# Patient Record
Sex: Female | Born: 1965 | Race: White | Hispanic: No | Marital: Married
Health system: Southern US, Community
[De-identification: ages and names within clinical notes are randomized; demographics above are authoritative.]

## PROBLEM LIST (undated history)

## (undated) DIAGNOSIS — I10 Essential (primary) hypertension: Secondary | ICD-10-CM

---

## 2017-04-09 ENCOUNTER — Emergency Department: Payer: BLUE CROSS/BLUE SHIELD

## 2017-04-09 ENCOUNTER — Encounter: Payer: Self-pay | Admitting: Medical Oncology

## 2017-04-09 ENCOUNTER — Emergency Department
Admission: EM | Admit: 2017-04-09 | Discharge: 2017-04-09 | Disposition: A | Discharge status: Home / Self Care | Payer: BLUE CROSS/BLUE SHIELD | Attending: Student in an Organized Health Care Education/Training Program | Admitting: Student in an Organized Health Care Education/Training Program

## 2017-04-09 DIAGNOSIS — Y939 Activity, unspecified: Secondary | ICD-10-CM | POA: Insufficient documentation

## 2017-04-09 DIAGNOSIS — M542 Cervicalgia: Secondary | ICD-10-CM | POA: Diagnosis not present

## 2017-04-09 DIAGNOSIS — I10 Essential (primary) hypertension: Secondary | ICD-10-CM | POA: Diagnosis not present

## 2017-04-09 DIAGNOSIS — M25521 Pain in right elbow: Secondary | ICD-10-CM | POA: Diagnosis not present

## 2017-04-09 DIAGNOSIS — R1012 Left upper quadrant pain: Secondary | ICD-10-CM | POA: Diagnosis not present

## 2017-04-09 DIAGNOSIS — Y92411 Interstate highway as the place of occurrence of the external cause: Secondary | ICD-10-CM | POA: Insufficient documentation

## 2017-04-09 DIAGNOSIS — Y999 Unspecified external cause status: Secondary | ICD-10-CM | POA: Diagnosis not present

## 2017-04-09 DIAGNOSIS — M25561 Pain in right knee: Secondary | ICD-10-CM | POA: Diagnosis not present

## 2017-04-09 DIAGNOSIS — R52 Pain, unspecified: Secondary | ICD-10-CM

## 2017-04-09 HISTORY — DX: Essential (primary) hypertension: I10

## 2017-04-09 LAB — CBC WITH DIFFERENTIAL/PLATELET
BASOS ABS: 0.1 10*3/uL (ref 0–0.1)
BASOS PCT: 1 %
Eosinophils Absolute: 0.2 10*3/uL (ref 0–0.7)
Eosinophils Relative: 2 %
HCT: 41.4 % (ref 35.0–47.0)
Hemoglobin: 14.4 g/dL (ref 12.0–16.0)
LYMPHS ABS: 1.6 10*3/uL (ref 1.0–3.6)
Lymphocytes Relative: 20 %
MCH: 29.9 pg (ref 26.0–34.0)
MCHC: 34.9 g/dL (ref 32.0–36.0)
MCV: 85.7 fL (ref 80.0–100.0)
MONO ABS: 0.6 10*3/uL (ref 0.2–0.9)
MONOS PCT: 7 %
NEUTROS ABS: 5.8 10*3/uL (ref 1.4–6.5)
NEUTROS PCT: 70 %
PLATELETS: 320 10*3/uL (ref 150–440)
RBC: 4.84 MIL/uL (ref 3.80–5.20)
RDW: 12.9 % (ref 11.5–14.5)
WBC: 8.2 10*3/uL (ref 3.6–11.0)

## 2017-04-09 LAB — BASIC METABOLIC PANEL
Anion gap: 6 (ref 5–15)
BUN: 15 mg/dL (ref 6–20)
CALCIUM: 9.3 mg/dL (ref 8.9–10.3)
CO2: 29 mmol/L (ref 22–32)
Chloride: 101 mmol/L (ref 101–111)
Creatinine, Ser: 0.98 mg/dL (ref 0.44–1.00)
GFR calc Af Amer: >60 mL/min (ref >60)
Glucose, Bld: 102 mg/dL — ABNORMAL HIGH (ref 65–99)
POTASSIUM: 3.3 mmol/L — AB (ref 3.5–5.1)
SODIUM: 136 mmol/L (ref 135–145)

## 2017-04-09 LAB — URINALYSIS, COMPLETE (UACMP) WITH MICROSCOPIC
BILIRUBIN URINE: NEGATIVE
Glucose, UA: NEGATIVE mg/dL
Hgb urine dipstick: NEGATIVE
LEUKOCYTES UA: NEGATIVE
Nitrite: NEGATIVE
PH: 6 (ref 5.0–8.0)
Protein, ur: 30 mg/dL — AB
Specific Gravity, Urine: 1.02 (ref 1.005–1.030)

## 2017-04-09 MED ORDER — CYCLOBENZAPRINE HCL 10 MG PO TABS: 10.0000 mg | ORAL_TABLET | Freq: Three times a day (TID) | ORAL | 0 refills | Status: AC | PRN

## 2017-04-09 MED ORDER — HYDROCODONE-ACETAMINOPHEN 5-325 MG PO TABS
1.0000 | ORAL_TABLET | Freq: Once | ORAL | Status: DC
  Filled 2017-04-09: qty 1

## 2017-04-09 MED ORDER — IOPAMIDOL (ISOVUE-300) INJECTION 61%
100.0000 mL | Freq: Once | INTRAVENOUS | Status: AC | PRN
  Administered 2017-04-09: 100 mL via INTRAVENOUS
  Filled 2017-04-09: qty 100

## 2017-04-09 NOTE — ED Notes (Signed)

## 2017-04-09 NOTE — ED Notes (Signed)
20 G IV removed from Left AC. Area clean, dry and intact. IV catheter removed intact.
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
## 2017-04-09 NOTE — ED Notes (Signed)
E-signature pad not working in treatment room. Pt verbally acknowledged d/c instructions, and states no further questions for treatment team.

## 2017-04-09 NOTE — Discharge Instructions (Signed)
Hepatobiliary: A 15 mm hypodense lesion in the dome of the liver is not well characterized but demonstrate fluid attenuation and most likely represents a cyst or hemangioma. A faint Smaller low-attenuation lesion in the inferior right lobe of the liver (series 2, image 29) is also not characterized but likely represents a cyst or hemangioma. The liver is otherwise unremarkable. No intrahepatic biliary ductal dilatation. The gallbladder is not visualized, likely surgically absent.   Pancreas: Unremarkable. No pancreatic ductal dilatation or surrounding inflammatory changes.   Spleen: Normal in size without focal abnormality.   Adrenals/Urinary Tract: Adrenal glands are unremarkable. Kidneys are normal, without renal calculi, focal lesion, or hydronephrosis. Bladder is unremarkable.   Stomach/Bowel: Moderate amount of stool noted throughout the colon. There is no evidence of bowel obstruction or active inflammation. Normal appendix.   Vascular/Lymphatic: No significant vascular findings are present. No enlarged abdominal or pelvic lymph nodes.   Reproductive: Hysterectomy. The ovaries appear unremarkable. There is a 2 cm left ovarian dominant follicle/ cyst. No pelvic mass.   Other: Small fat containing umbilical hernia.   Musculoskeletal: No acute or significant osseous findings.   IMPRESSION: No acute/ traumatic intra-abdominal or pelvic pathology.
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
## 2017-04-09 NOTE — ED Notes (Signed)
Pt ambulated to the bathroom on her own

## 2017-04-09 NOTE — ED Notes (Signed)
Patient transported to CT 
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
